# Patient Record
Sex: Male | Born: 1970 | Race: Black or African American | Hispanic: No | Marital: Married | Smoking: Never smoker
Health system: Southern US, Community
[De-identification: ages and names within clinical notes are randomized; demographics above are authoritative.]

## PROBLEM LIST (undated history)

## (undated) DIAGNOSIS — G4730 Sleep apnea, unspecified: Secondary | ICD-10-CM

## (undated) DIAGNOSIS — K219 Gastro-esophageal reflux disease without esophagitis: Secondary | ICD-10-CM

## (undated) HISTORY — DX: Morbid (severe) obesity due to excess calories: E66.01

## (undated) HISTORY — PX: QUADRICEPS TENDON REPAIR: SHX756

---

## 2006-09-09 ENCOUNTER — Other Ambulatory Visit: Payer: Self-pay

## 2006-09-09 ENCOUNTER — Emergency Department: Payer: Self-pay | Admitting: Emergency Medicine

## 2007-05-29 ENCOUNTER — Emergency Department: Payer: Self-pay | Admitting: Internal Medicine

## 2010-02-05 ENCOUNTER — Emergency Department: Payer: Self-pay | Admitting: Internal Medicine

## 2011-09-30 ENCOUNTER — Emergency Department: Payer: Self-pay | Admitting: *Deleted

## 2011-09-30 LAB — PROTIME-INR
INR: 1
Prothrombin Time: 13.7 secs (ref 11.5–14.7)

## 2011-09-30 LAB — COMPREHENSIVE METABOLIC PANEL
Albumin: 3.7 g/dL (ref 3.4–5.0)
Alkaline Phosphatase: 59 U/L (ref 50–136)
BUN: 12 mg/dL (ref 7–18)
Bilirubin,Total: 0.5 mg/dL (ref 0.2–1.0)
Chloride: 107 mmol/L (ref 98–107)
Co2: 27 mmol/L (ref 21–32)
Creatinine: 0.86 mg/dL (ref 0.60–1.30)
EGFR (African American): >60
EGFR (Non-African Amer.): >60
Glucose: 101 mg/dL — ABNORMAL HIGH (ref 65–99)
Osmolality: 285 (ref 275–301)
Total Protein: 7.2 g/dL (ref 6.4–8.2)

## 2011-09-30 LAB — CBC
HGB: 15.8 g/dL (ref 13.0–18.0)
MCH: 30.1 pg (ref 26.0–34.0)
Platelet: 174 10*3/uL (ref 150–440)
RDW: 14.3 % (ref 11.5–14.5)
WBC: 6.8 10*3/uL (ref 3.8–10.6)

## 2011-09-30 LAB — CK TOTAL AND CKMB (NOT AT ARMC)
CK, Total: 1177 U/L — ABNORMAL HIGH (ref 35–232)
CK-MB: 6 ng/mL — ABNORMAL HIGH (ref 0.5–3.6)

## 2011-09-30 LAB — TROPONIN I: Troponin-I: <0.02 ng/mL

## 2011-10-06 ENCOUNTER — Other Ambulatory Visit: Payer: Self-pay | Admitting: Internal Medicine

## 2011-10-06 LAB — COMPREHENSIVE METABOLIC PANEL
Albumin: 4 g/dL (ref 3.4–5.0)
Bilirubin,Total: 0.7 mg/dL (ref 0.2–1.0)
Calcium, Total: 8.7 mg/dL (ref 8.5–10.1)
Chloride: 106 mmol/L (ref 98–107)
Co2: 28 mmol/L (ref 21–32)
Creatinine: 1.04 mg/dL (ref 0.60–1.30)
Glucose: 100 mg/dL — ABNORMAL HIGH (ref 65–99)
Osmolality: 285 (ref 275–301)
SGOT(AST): 28 U/L (ref 15–37)

## 2011-10-15 ENCOUNTER — Ambulatory Visit: Payer: Self-pay | Admitting: Cardiology

## 2014-11-05 ENCOUNTER — Ambulatory Visit: Payer: Self-pay | Admitting: Internal Medicine

## 2015-12-26 ENCOUNTER — Emergency Department
Admission: EM | Admit: 2015-12-26 | Discharge: 2015-12-26 | Disposition: A | Discharge status: Home / Self Care | Payer: BC Managed Care – PPO | Attending: Emergency Medicine | Admitting: Emergency Medicine

## 2015-12-26 ENCOUNTER — Emergency Department: Payer: BC Managed Care – PPO

## 2015-12-26 DIAGNOSIS — Y929 Unspecified place or not applicable: Secondary | ICD-10-CM | POA: Diagnosis not present

## 2015-12-26 DIAGNOSIS — W228XXA Striking against or struck by other objects, initial encounter: Secondary | ICD-10-CM | POA: Insufficient documentation

## 2015-12-26 DIAGNOSIS — Y939 Activity, unspecified: Secondary | ICD-10-CM | POA: Diagnosis not present

## 2015-12-26 DIAGNOSIS — Y999 Unspecified external cause status: Secondary | ICD-10-CM | POA: Insufficient documentation

## 2015-12-26 DIAGNOSIS — S91331A Puncture wound without foreign body, right foot, initial encounter: Secondary | ICD-10-CM

## 2015-12-26 DIAGNOSIS — M79671 Pain in right foot: Secondary | ICD-10-CM | POA: Diagnosis present

## 2015-12-26 MED ORDER — CIPROFLOXACIN HCL 500 MG PO TABS: 500.0000 mg | ORAL_TABLET | Freq: Two times a day (BID) | ORAL | Status: AC

## 2015-12-26 MED ORDER — HYDROCODONE-ACETAMINOPHEN 5-325 MG PO TABS: 1.0000 | ORAL_TABLET | ORAL | Status: DC | PRN

## 2015-12-26 MED ORDER — NAPROXEN 500 MG PO TABS: 500.0000 mg | ORAL_TABLET | Freq: Two times a day (BID) | ORAL | Status: DC

## 2015-12-26 MED ORDER — TETANUS-DIPHTH-ACELL PERTUSSIS 5-2.5-18.5 LF-MCG/0.5 IM SUSP
0.5000 mL | Freq: Once | INTRAMUSCULAR | Status: AC
  Administered 2015-12-26: 0.5 mL via INTRAMUSCULAR
  Filled 2015-12-26: qty 0.5

## 2015-12-26 NOTE — ED Provider Notes (Signed)
[facility] Emergency Department Provider Note  ____________________________________________  Time seen: Approximately 11:37 AM  I have reviewed the triage vital signs and the nursing notes.   HISTORY  Chief Complaint Foot Pain    HPI Mario Porter is a 45 y.o. male  presenting with right foot pain for 1 day. He reports that last night he stepped on a plug and the prongs penetrated the bottom of his right foot. The wound was cleaned and dressed shortly after the incident. Current pain when bearing weight is a 6-8/10. Denies tingling, numbness, or active bleeding.    History reviewed. No pertinent past medical history.  There are no active problems to display for this patient.   History reviewed. No pertinent past surgical history.  Current Outpatient Rx  Name  Route  Sig  Dispense  Refill  . ciprofloxacin (CIPRO) 500 MG tablet   Oral   Take 1 tablet (500 mg total) by mouth 2 (two) times daily.   20 tablet   0   . HYDROcodone-acetaminophen (NORCO) 5-325 MG tablet   Oral   Take 1-2 tablets by mouth every 4 (four) hours as needed for moderate pain.   10 tablet   0   . naproxen (NAPROSYN) 500 MG tablet   Oral   Take 1 tablet (500 mg total) by mouth 2 (two) times daily with a meal.   60 tablet   0     Allergies Review of patient's allergies indicates no known allergies.  No family history on file.  Social History Social History  Substance Use Topics  . Smoking status: Never Smoker   . Smokeless tobacco: None  . Alcohol Use: Yes    Review of Systems Constitutional: No fever/chills Eyes: No visual changes. Cardiovascular: Denies chest pain. Respiratory: Denies shortness of breath. Gastrointestinal: No abdominal pain.  No nausea, no vomiting.   Musculoskeletal: Negative for back pain. Neurological: Negative for headaches, focal weakness or numbness.  10-point ROS otherwise  negative.  ____________________________________________   PHYSICAL EXAM:  VITAL SIGNS: ED Triage Vitals  Enc Vitals Group     BP 12/26/15 1109 164/104 mmHg     Pulse Rate 12/26/15 1109 105     Resp 12/26/15 1109 18     Temp 12/26/15 1109 98.3 F (36.8 C)     Temp Source 12/26/15 1109 Oral     SpO2 12/26/15 1109 97 %     Weight 12/26/15 1104 330 lb (149.687 kg)     Height 12/26/15 1104 6\' 3"  (1.905 m)     Head Cir --      Peak Flow --      Pain Score 12/26/15 1104 7     Pain Loc --      Pain Edu? --      Excl. in GC? --     Constitutional: Alert and oriented. Well appearing and in no acute distress. Eyes: Conjunctivae are normal. PERRL. Head: Atraumatic. Cardiovascular: Normal rate, regular rhythm.  Respiratory: Normal respiratory effort.  No retractions. Lungs CTAB. Musculoskeletal: No lower extremity tenderness nor edema.  No joint effusions. Neurologic:  Normal speech and language. No gross focal neurologic deficits are appreciated.  Skin:  Bottom right foot puncture wound.  Psychiatric: Mood and affect are normal. Speech and behavior are normal.  ____________________________________________   LABS (all labs ordered are listed, but only abnormal results are displayed)  Labs Reviewed - No data to display ____________________________________________  RADIOLOGY No acute fracture or subluxation. No radiopaque foreign body.  ____________________________________________   PROCEDURES  Procedure(s) performed: None  Critical Care performed: No  ____________________________________________   INITIAL IMPRESSION / ASSESSMENT AND PLAN / ED COURSE  Pertinent labs & imaging results that were available during my care of the patient were reviewed by me and considered in my medical decision making (see chart for details).  Puncture wound on plantar aspect of the right foot. Prescribed Ciprofloxacillin, 10 tablets Norco, and naproxen. Advised patient to keep wound  clean and dressed. Follow up with PCP or back to the ED if symptoms persist or worsen.  ____________________________________________   FINAL CLINICAL IMPRESSION(S) / ED DIAGNOSES  Final diagnoses:  Puncture wound of plantar aspect of foot, right, initial encounter     This chart was dictated using voice recognition software/Dragon. Despite best efforts to proofread, errors can occur which can change the meaning. Any change was purely unintentional.   Evangeline Dakin, PA-C 12/26/15 1430  Sharman Cheek, MD 12/26/15 1536

## 2015-12-26 NOTE — ED Notes (Signed)
See triage note   States he stepped on plug last pm    States he the prongs went into foot  Increased pain this am

## 2015-12-26 NOTE — ED Notes (Signed)
Pt states he stepped on the prongs of a plug that stuck into his right foot yesterday and is having pain and swelling.

## 2015-12-26 NOTE — Discharge Instructions (Signed)
Puncture Wound A puncture wound is an injury that is caused by a sharp, thin object that goes through (penetrates) your skin. Usually, a puncture wound does not leave a large opening in your skin, so it may not bleed a lot. However, when you get a puncture wound, dirt or other materials (foreign bodies) can be forced into your wound and break off inside. This increases the chance of infection, such as tetanus. CAUSES Puncture wounds are caused by any sharp, thin object that goes through your skin, such as:  Animal teeth, as with an animal bite.  Sharp, pointed objects, such as nails, splinters of glass, fishhooks, and needles. SYMPTOMS Symptoms of a puncture wound include:  Pain.  Bleeding.  Swelling.  Bruising.  Fluid leaking from the wound.  Numbness, tingling, or loss of function. DIAGNOSIS This condition is diagnosed with a medical history and physical exam. Your wound will be checked to see if it contains any foreign bodies. You may also have X-rays or other imaging tests. TREATMENT Treatment for a puncture wound depends on how serious the wound is. It also depends on whether the wound contains any foreign bodies. Treatment for all types of puncture wounds usually starts with:  Controlling the bleeding.  Washing out the wound with a germ-free (sterile) salt-water solution.  Checking the wound for foreign bodies. Treatment may also include:  Having the wound opened surgically to remove a foreign object.  Closing the wound with stitches (sutures) if it continues to bleed.  Covering the wound with antibiotic ointments and a bandage (dressing).  Receiving a tetanus shot.  Receiving a rabies vaccine. HOME CARE INSTRUCTIONS Medicines  Take or apply over-the-counter and prescription medicines only as told by your health care provider.  If you were prescribed an antibiotic, take or apply it as told by your health care provider. Do not stop using the antibiotic even if  your condition improves. Wound Care  There are many ways to close and cover a wound. For example, a wound can be covered with sutures, skin glue, or adhesive strips. Follow instructions from your health care provider about:  How to take care of your wound.  When and how you should change your dressing.  When you should remove your dressing.  Removing whatever was used to close your wound.  Keep the dressing dry as told by your health care provider. Do not take baths, swim, use a hot tub, or do anything that would put your wound underwater until your health care provider approves.  Clean the wound as told by your health care provider.  Do not scratch or pick at the wound.  Check your wound every day for signs of infection. Watch for:  Redness, swelling, or pain.  Fluid, blood, or pus. General Instructions  Raise (elevate) the injured area above the level of your heart while you are sitting or lying down.  If your puncture wound is in your foot, ask your health care provider if you need to avoid putting weight on your foot and for how long.  Keep all follow-up visits as told by your health care provider. This is important. SEEK MEDICAL CARE IF:  You received a tetanus shot and you have swelling, severe pain, redness, or bleeding at the injection site.  You have a fever.  Your sutures come out.  You notice a bad smell coming from your wound or your dressing.  You notice something coming out of your wound, such as wood or glass.  Your   pain is not controlled with medicine.  You have increased redness, swelling, or pain at the site of your wound.  You have fluid, blood, or pus coming from your wound.  You notice a change in the color of your skin near your wound.  You need to change the dressing frequently due to fluid, blood, or pus draining from your wound.  You develop a new rash.  You develop numbness around your wound. SEEK IMMEDIATE MEDICAL CARE IF:  You  develop severe swelling around your wound.  Your pain suddenly increases and is severe.  You develop painful skin lumps.  You have a red streak going away from your wound.  The wound is on your hand or foot and you cannot properly move a finger or toe.  The wound is on your hand or foot and you notice that your fingers or toes look pale or bluish.   This information is not intended to replace advice given to you by your health care provider. Make sure you discuss any questions you have with your health care provider.   Document Released: 03/07/2005 Document Revised: 02/16/2015 Document Reviewed: 07/21/2014 Elsevier Interactive Patient Education 2016 Elsevier Inc.  

## 2016-11-27 ENCOUNTER — Ambulatory Visit (INDEPENDENT_AMBULATORY_CARE_PROVIDER_SITE_OTHER): Payer: BC Managed Care – PPO | Admitting: Family Medicine

## 2016-11-27 ENCOUNTER — Encounter: Payer: Self-pay | Admitting: Family Medicine

## 2016-11-27 VITALS — BP 140/90 | HR 97 | Temp 98.0°F | Resp 14 | Ht 76.0 in | Wt 352.0 lb

## 2016-11-27 DIAGNOSIS — R0683 Snoring: Secondary | ICD-10-CM

## 2016-11-27 DIAGNOSIS — G4733 Obstructive sleep apnea (adult) (pediatric): Secondary | ICD-10-CM | POA: Insufficient documentation

## 2016-11-27 DIAGNOSIS — R030 Elevated blood-pressure reading, without diagnosis of hypertension: Secondary | ICD-10-CM | POA: Diagnosis not present

## 2016-11-27 DIAGNOSIS — Z0001 Encounter for general adult medical examination with abnormal findings: Secondary | ICD-10-CM

## 2016-11-27 DIAGNOSIS — I10 Essential (primary) hypertension: Secondary | ICD-10-CM | POA: Insufficient documentation

## 2016-11-27 LAB — HEMOGLOBIN A1C: Hgb A1c MFr Bld: 6.3 % (ref 4.6–6.5)

## 2016-11-27 LAB — COMPREHENSIVE METABOLIC PANEL
ALBUMIN: 4.7 g/dL (ref 3.5–5.2)
ALK PHOS: 52 U/L (ref 39–117)
ALT: 31 U/L (ref 0–53)
AST: 25 U/L (ref 0–37)
BUN: 13 mg/dL (ref 6–23)
CO2: 28 mEq/L (ref 19–32)
CREATININE: 1.05 mg/dL (ref 0.40–1.50)
Calcium: 9.9 mg/dL (ref 8.4–10.5)
Chloride: 103 mEq/L (ref 96–112)
GFR: 97.11 mL/min (ref >60.00)
Glucose, Bld: 109 mg/dL — ABNORMAL HIGH (ref 70–99)
POTASSIUM: 4.6 meq/L (ref 3.5–5.1)
SODIUM: 139 meq/L (ref 135–145)
TOTAL PROTEIN: 7.5 g/dL (ref 6.0–8.3)
Total Bilirubin: 1 mg/dL (ref 0.2–1.2)

## 2016-11-27 LAB — LIPID PANEL
CHOLESTEROL: 195 mg/dL (ref 0–200)
HDL: 55.5 mg/dL (ref >39.00)
LDL CALC: 118 mg/dL — AB (ref 0–99)
NONHDL: 139.97
Total CHOL/HDL Ratio: 4
Triglycerides: 111 mg/dL (ref 0.0–149.0)
VLDL: 22.2 mg/dL (ref 0.0–40.0)

## 2016-11-27 LAB — CBC
HCT: 50.4 % (ref 39.0–52.0)
Hemoglobin: 17 g/dL (ref 13.0–17.0)
MCHC: 33.7 g/dL (ref 30.0–36.0)
MCV: 89.9 fl (ref 78.0–100.0)
PLATELETS: 178 10*3/uL (ref 150.0–400.0)
RBC: 5.61 Mil/uL (ref 4.22–5.81)
RDW: 14.2 % (ref 11.5–15.5)
WBC: 7.8 10*3/uL (ref 4.0–10.5)

## 2016-11-27 LAB — PSA: PSA: 0.54 ng/mL (ref 0.10–4.00)

## 2016-11-27 LAB — TSH: TSH: 2 u[IU]/mL (ref 0.35–4.50)

## 2016-11-27 NOTE — Assessment & Plan Note (Signed)
Screening labs today. Advise dietary changes and exercise to promote weight loss. This will also help with his blood pressure. He is in agreement to pursue lifestyle changes. Tetanus up-to-date. Patient reporting snoring and fatigue. Given his obesity, he is concerned for OSA. Arranging sleep study.

## 2016-11-27 NOTE — Patient Instructions (Signed)
Follow up in 3 months.  Take care  Dr. Adriana Simas    Health Maintenance, Male A healthy lifestyle and preventive care is important for your health and wellness. Ask your health care provider about what schedule of regular examinations is right for you. What should I know about weight and diet? Eat a Healthy Diet  Eat plenty of vegetables, fruits, whole grains, low-fat dairy products, and lean protein.  Do not eat a lot of foods high in solid fats, added sugars, or salt.  Maintain a Healthy Weight Regular exercise can help you achieve or maintain a healthy weight. You should:  Do at least 150 minutes of exercise each week. The exercise should increase your heart rate and make you sweat (moderate-intensity exercise).  Do strength-training exercises at least twice a week.  Watch Your Levels of Cholesterol and Blood Lipids  Have your blood tested for lipids and cholesterol every 5 years starting at 48 years of age. If you are at high risk for heart disease, you should start having your blood tested when you are 48 years old. You may need to have your cholesterol levels checked more often if: ? Your lipid or cholesterol levels are high. ? You are older than 48 years of age. ? You are at high risk for heart disease.  What should I know about cancer screening? Many types of cancers can be detected early and may often be prevented. Lung Cancer  You should be screened every year for lung cancer if: ? You are a current smoker who has smoked for at least 30 years. ? You are a former smoker who has quit within the past 15 years.  Talk to your health care provider about your screening options, when you should start screening, and how often you should be screened.  Colorectal Cancer  Routine colorectal cancer screening usually begins at 48 years of age and should be repeated every 5-10 years until you are 48 years old. You may need to be screened more often if early forms of precancerous polyps  or small growths are found. Your health care provider may recommend screening at an earlier age if you have risk factors for colon cancer.  Your health care provider may recommend using home test kits to check for hidden blood in the stool.  A small camera at the end of a tube can be used to examine your colon (sigmoidoscopy or colonoscopy). This checks for the earliest forms of colorectal cancer.  Prostate and Testicular Cancer  Depending on your age and overall health, your health care provider may do certain tests to screen for prostate and testicular cancer.  Talk to your health care provider about any symptoms or concerns you have about testicular or prostate cancer.  Skin Cancer  Check your skin from head to toe regularly.  Tell your health care provider about any new moles or changes in moles, especially if: ? There is a change in a mole's size, shape, or color. ? You have a mole that is larger than a pencil eraser.  Always use sunscreen. Apply sunscreen liberally and repeat throughout the day.  Protect yourself by wearing long sleeves, pants, a wide-brimmed hat, and sunglasses when outside.  What should I know about heart disease, diabetes, and high blood pressure?  If you are 55-40 years of age, have your blood pressure checked every 3-5 years. If you are 48 years of age or older, have your blood pressure checked every year. You should have your blood  pressure measured twice-once when you are at a hospital or clinic, and once when you are not at a hospital or clinic. Record the average of the two measurements. To check your blood pressure when you are not at a hospital or clinic, you can use: ? An automated blood pressure machine at a pharmacy. ? A home blood pressure monitor.  Talk to your health care provider about your target blood pressure.  If you are between 7645-48 years old, ask your health care provider if you should take aspirin to prevent heart disease.  Have  regular diabetes screenings by checking your fasting blood sugar level. ? If you are at a normal weight and have a low risk for diabetes, have this test once every three years after the age of 48. ? If you are overweight and have a high risk for diabetes, consider being tested at a younger age or more often.  A one-time screening for abdominal aortic aneurysm (AAA) by ultrasound is recommended for men aged 65-75 years who are current or former smokers. What should I know about preventing infection? Hepatitis B If you have a higher risk for hepatitis B, you should be screened for this virus. Talk with your health care provider to find out if you are at risk for hepatitis B infection. Hepatitis C Blood testing is recommended for:  Everyone born from [birth year] through 1967.  Anyone with known risk factors for hepatitis C.  Sexually Transmitted Diseases (STDs)  You should be screened each year for STDs including gonorrhea and chlamydia if: ? You are sexually active and are younger than 48 years of age. ? You are older than 48 years of age and your health care provider tells you that you are at risk for this type of infection. ? Your sexual activity has changed since you were last screened and you are at an increased risk for chlamydia or gonorrhea. Ask your health care provider if you are at risk.  Talk with your health care provider about whether you are at high risk of being infected with HIV. Your health care provider may recommend a prescription medicine to help prevent HIV infection.  What else can I do?  Schedule regular health, dental, and eye exams.  Stay current with your vaccines (immunizations).  Do not use any tobacco products, such as cigarettes, chewing tobacco, and e-cigarettes. If you need help quitting, ask your health care provider.  Limit alcohol intake to no more than 2 drinks per day. One drink equals 12 ounces of beer, 5 ounces of wine, or 1 ounces of hard liquor.  Do  not use street drugs.  Do not share needles.  Ask your health care provider for help if you need support or information about quitting drugs.  Tell your health care provider if you often feel depressed.  Tell your health care provider if you have ever been abused or do not feel safe at home. This information is not intended to replace advice given to you by your health care provider. Make sure you discuss any questions you have with your health care provider. Document Released: 11/24/2007 Document Revised: 01/25/2016 Document Reviewed: 03/01/2015 Elsevier Interactive Patient Education  [employer]2018 Elsevier Inc.

## 2016-11-27 NOTE — Progress Notes (Signed)
Pre-visit discussion using our clinic review tool. No additional management support is needed unless otherwise documented below in the visit note.  

## 2016-11-27 NOTE — Progress Notes (Signed)
   Subjective:  Patient ID: Mario Porter, male    DOB: 08/15/1970  Age: 46 y.o. MRN: [id number]  CC: Establish care; Concern for OSA  HPI Mario MaeShawn Beale is a 46 y.o. male presents to the clinic today to establish care. He is in need of a physical exam. He is concerned that he has OSA.  Preventative Healthcare  Immunizations  Tetanus - Up to date.  Prostate cancer screening: PSA today.  Labs: Screening labs.  Exercise: Not currently exercising.  Alcohol use: Yes.   Smoking/tobacco use: No.  PMH, Surgical Hx, Family Hx, Social History reviewed and updated as below.  Past Medical History:  Diagnosis Date  . Morbid obesity (HCC)    Past Surgical History:  Procedure Laterality Date  . QUADRICEPS TENDON REPAIR     Family History  Problem Relation Age of Onset  . Hyperlipidemia Father    Social History  Substance Use Topics  . Smoking status: Never Smoker  . Smokeless tobacco: Never Used  . Alcohol use Yes    Review of Systems  Constitutional: Positive for fatigue.       Weight gain.  Respiratory:       Snores loudly.  Neurological: Positive for dizziness and headaches.  All other systems reviewed and are negative.  Objective:   Today's Vitals: BP 140/90 (BP Location: Right Arm, Cuff Size: Large)   Pulse 97   Temp 98 F (36.7 C) (Oral)   Resp 14   Ht 6\' 4"  (1.93 m)   Wt (!) 352 lb (159.7 kg)   BMI 42.85 kg/m   Physical Exam  Constitutional: He is oriented to person, place, and time. He appears well-developed and well-nourished. No distress.  Morbidly obese.  HENT:  Head: Normocephalic and atraumatic.  Nose: Nose normal.  Mouth/Throat: Oropharynx is clear and moist. No oropharyngeal exudate.  Normal TM's bilaterally.   Eyes: Conjunctivae are normal. No scleral icterus.  Neck: Neck supple. No thyromegaly present.  Cardiovascular: Normal rate and regular rhythm.   No murmur heard. Pulmonary/Chest: Effort normal and breath sounds normal. He has no  wheezes. He has no rales.  Abdominal: Soft. He exhibits no distension. There is no tenderness. There is no rebound and no guarding.  Musculoskeletal: Normal range of motion. He exhibits no edema.  Lymphadenopathy:    He has no cervical adenopathy.  Neurological: He is alert and oriented to person, place, and time.  Skin: Skin is warm and dry. No rash noted.  Psychiatric: He has a normal mood and affect.  Vitals reviewed.  Assessment & Plan:   Problem List Items Addressed This Visit      Other   Elevated BP without diagnosis of hypertension   Encounter for general adult medical examination with abnormal findings - Primary    Screening labs today. Advise dietary changes and exercise to promote weight loss. This will also help with his blood pressure. He is in agreement to pursue lifestyle changes. Tetanus up-to-date. Patient reporting snoring and fatigue. Given his obesity, he is concerned for OSA. Arranging sleep study.      Relevant Orders   CBC   Hemoglobin A1c   Comprehensive metabolic panel   Lipid panel   PSA   TSH   Snoring   Relevant Orders   Split night study     Follow-up: 3 months  Laporcha Marchesi Adriana Simasook DO [facility] [place]

## 2016-11-30 ENCOUNTER — Telehealth: Payer: Self-pay | Admitting: *Deleted

## 2016-11-30 NOTE — Telephone Encounter (Signed)
Patient has requested a update on his sleep study referral Pt contact [phone]

## 2016-12-04 NOTE — Telephone Encounter (Signed)
Pt was notified when he returned call, he understood.

## 2016-12-04 NOTE — Telephone Encounter (Signed)
Tried calling pt back to let him know the referral has been sent to [facility]. It will take a couple weeks for insurance authorization, but they will call him to schedule an appt for home sleep study.

## 2016-12-14 ENCOUNTER — Telehealth: Payer: Self-pay | Admitting: Family Medicine

## 2016-12-14 NOTE — Telephone Encounter (Signed)
Pt called requesting a update on his sleep study results. Advised pt that it does not look like we have received them yet.

## 2016-12-18 NOTE — Telephone Encounter (Signed)
Requested sleep study from [place] from [facility] [phone].

## 2016-12-18 NOTE — Telephone Encounter (Signed)
Results in your folder for review.

## 2016-12-19 NOTE — Telephone Encounter (Signed)
Patient advised of below and verbalized understanding.  

## 2016-12-19 NOTE — Telephone Encounter (Signed)
Has OSA. Recommended to have titration study.

## 2016-12-19 NOTE — Telephone Encounter (Signed)
Do I need to call and order sleep titration study through [facility] or will that be placed through referral . Thanks

## 2016-12-20 NOTE — Telephone Encounter (Signed)
Per Efraim KaufmannMelissa should be arranged by company.

## 2017-01-01 ENCOUNTER — Telehealth: Payer: Self-pay | Admitting: *Deleted

## 2017-01-01 NOTE — Telephone Encounter (Signed)
This was supposed to be already arranged/done by the company. Can you look into this for me?
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
## 2017-01-01 NOTE — Telephone Encounter (Signed)
Pt has requested a in reference to having a titration study   Pt contact [phone]

## 2017-01-07 ENCOUNTER — Telehealth: Payer: Self-pay | Admitting: Family Medicine

## 2017-01-07 NOTE — Telephone Encounter (Signed)
Insurance has called to get diangosis code for his equipment. [facility] has arranged this.

## 2017-01-07 NOTE — Telephone Encounter (Signed)
Joni Reiningicole from [employer] is requesting a DME Code for pt's cpap machine. She is trying to confirm if pt needs prior authorization. Please call Joni Reiningicole at [phone], any one that answerers the phone can help.

## 2017-01-08 NOTE — Telephone Encounter (Signed)
Spoke with Suzi RootsKaletpa B at [facility] and DME provider has called in today and  They were advised CPAP doesn't require prior authorization .

## 2017-01-25 NOTE — Telephone Encounter (Signed)
Patient states he still heard in regards to his CPAP machine   Can you help him in regards to who to contact.   Please thanks.

## 2017-01-25 NOTE — Telephone Encounter (Signed)
Pt called looking for an update on this and also wanted to know if we received the report from the Respiratory therapist. Please advise, thank you!  Call pt @ [phone]

## 2017-01-25 NOTE — Telephone Encounter (Signed)
Tried calling patient voicemail box full

## 2017-01-28 NOTE — Telephone Encounter (Signed)
Tried calling pt-vm is full. He can not get a machine until he has a cpap titration study. I have sent this order to [facility].

## 2017-01-31 ENCOUNTER — Ambulatory Visit: Payer: BC Managed Care – PPO | Attending: Neurology

## 2017-01-31 DIAGNOSIS — G4733 Obstructive sleep apnea (adult) (pediatric): Secondary | ICD-10-CM | POA: Diagnosis not present

## 2017-01-31 DIAGNOSIS — G4761 Periodic limb movement disorder: Secondary | ICD-10-CM | POA: Insufficient documentation

## 2017-02-19 ENCOUNTER — Telehealth: Payer: Self-pay | Admitting: *Deleted

## 2017-02-19 NOTE — Telephone Encounter (Signed)
Pt requested his sleep study results  Pt contact [phone]

## 2017-02-19 NOTE — Telephone Encounter (Signed)
Can you advise on what sleep study results showed please ? Thanks

## 2017-02-19 NOTE — Telephone Encounter (Signed)
Showed OSA. This has been asked previously. I filled out paperwork for CPAP.

## 2017-02-19 NOTE — Telephone Encounter (Signed)
Spoke with patient he states [facility]   I advised him that we had faxed Certificate of Medical Necessity for CPAP  to [facility]. Patient verbalized understanding.

## 2017-02-27 ENCOUNTER — Ambulatory Visit: Payer: BC Managed Care – PPO | Admitting: Family Medicine

## 2017-02-28 ENCOUNTER — Ambulatory Visit (INDEPENDENT_AMBULATORY_CARE_PROVIDER_SITE_OTHER): Payer: BC Managed Care – PPO | Admitting: Family Medicine

## 2017-02-28 ENCOUNTER — Encounter: Payer: Self-pay | Admitting: Family Medicine

## 2017-02-28 DIAGNOSIS — I10 Essential (primary) hypertension: Secondary | ICD-10-CM

## 2017-02-28 DIAGNOSIS — G4733 Obstructive sleep apnea (adult) (pediatric): Secondary | ICD-10-CM

## 2017-02-28 DIAGNOSIS — J309 Allergic rhinitis, unspecified: Secondary | ICD-10-CM | POA: Diagnosis not present

## 2017-02-28 MED ORDER — FLUTICASONE PROPIONATE 50 MCG/ACT NA SUSP: 2.0000 | Freq: Every day | NASAL | 6 refills | Status: DC

## 2017-02-28 MED ORDER — CETIRIZINE HCL 10 MG PO TABS: 10.0000 mg | ORAL_TABLET | Freq: Every day | ORAL | 1 refills | Status: DC

## 2017-02-28 NOTE — Assessment & Plan Note (Signed)
New problem. Treating with Flonase and Zyrtec.

## 2017-02-28 NOTE — Patient Instructions (Signed)
Medications as prescribed. ° °Take care ° °Dr. Allon Costlow  °

## 2017-02-28 NOTE — Progress Notes (Signed)
Subjective:  Patient ID: Mario Porter, male    DOB: 09-19-1970  Age: 46 y.o. MRN: [id number]  CC: Follow up; congestion  HPI:  46 year old male with hypertension, OSA presents for follow. Complains of congestion.  Obstructive sleep apnea  Patient has had a sleep study and has obstructive sleep apnea.  OSA causes daytime sleepiness and issues with cognition as a result.  He is going to get his CPAP machine today.  Hypertension  BP mildly elevated today.  Needs weight loss.  He is on no medications at this time.  Congestion  Patient has had recent sinus congestion and pressure.  Patient states that he likely has underlying seasonal allergies.  No purulent nasal discharge.  No associated fever.  No medications or interventions tried.  No known exacerbating factors. No other complaints or concerns this time.  Social Hx   Social History   Social History  . Marital status: Married    Spouse name: N/A  . Number of children: N/A  . Years of education: N/A   Social History Main Topics  . Smoking status: Never Smoker  . Smokeless tobacco: Never Used  . Alcohol use Yes  . Drug use: No  . Sexual activity: Not Asked   Other Topics Concern  . None   Social History Narrative  . None    Review of Systems  Constitutional: Positive for fatigue.  HENT: Positive for congestion.   Psychiatric/Behavioral: Positive for sleep disturbance.   Objective:  BP (!) 138/100 (BP Location: Left Arm, Patient Position: Sitting, Cuff Size: Large)   Pulse 93   Temp 98.3 F (36.8 C) (Oral)   Resp 18   Wt (!) 357 lb 6 oz (162.1 kg)   SpO2 95%   BMI 43.50 kg/m   BP/Weight 02/28/2017 11/27/2016 12/26/2015  Systolic BP 138 140 139  Diastolic BP 100 90 112  Wt. (Lbs) 357.38 352 330  BMI 43.5 42.85 41.25    Physical Exam  Constitutional: He is oriented to person, place, and time. He appears well-developed. No distress.  HENT:  Head: Normocephalic and atraumatic.    Edematous/enlarged turbinates.  Cardiovascular: Normal rate and regular rhythm.   No murmur heard. Pulmonary/Chest: Effort normal. He has no wheezes. He has no rales.  Neurological: He is alert and oriented to person, place, and time.  Psychiatric: He has a normal mood and affect.  Vitals reviewed.   Lab Results  Component Value Date   WBC 7.8 11/27/2016   HGB 17.0 11/27/2016   HCT 50.4 11/27/2016   PLT 178.0 11/27/2016   GLUCOSE 109 (H) 11/27/2016   CHOL 195 11/27/2016   TRIG 111.0 11/27/2016   HDL 55.50 11/27/2016   LDLCALC 118 (H) 11/27/2016   ALT 31 11/27/2016   AST 25 11/27/2016   NA 139 11/27/2016   K 4.6 11/27/2016   CL 103 11/27/2016   CREATININE 1.05 11/27/2016   BUN 13 11/27/2016   CO2 28 11/27/2016   TSH 2.00 11/27/2016   PSA 0.54 11/27/2016   INR 1.0 09/30/2011   HGBA1C 6.3 11/27/2016    Assessment & Plan:   Problem List Items Addressed This Visit    Allergic rhinitis    New problem. Treating with Flonase and Zyrtec.      Essential hypertension    BP mildly elevated. Stable. Needs weight loss.      OSA (obstructive sleep apnea)    Patient with confirmed OSA. Still having symptoms as he does not yet have his CPAP machine.  Experiencing excessive daytime sleepiness. Going to get CPAP today.         Meds ordered this encounter  Medications  . fluticasone (FLONASE) 50 MCG/ACT nasal spray    Sig: Place 2 sprays into both nostrils daily.    Dispense:  16 g    Refill:  6  . cetirizine (ZYRTEC) 10 MG tablet    Sig: Take 1 tablet (10 mg total) by mouth daily.    Dispense:  90 tablet    Refill:  1     Follow-up: PRN  Everlene Other DO [facility]

## 2017-02-28 NOTE — Assessment & Plan Note (Signed)
Patient with confirmed OSA. Still having symptoms as he does not yet have his CPAP machine. Experiencing excessive daytime sleepiness. Going to get CPAP today.

## 2017-02-28 NOTE — Assessment & Plan Note (Signed)
BP mildly elevated. Stable. Needs weight loss.

## 2017-12-02 ENCOUNTER — Encounter: Payer: BC Managed Care – PPO | Admitting: Family

## 2017-12-26 ENCOUNTER — Ambulatory Visit: Payer: BC Managed Care – PPO | Admitting: Podiatry

## 2017-12-26 ENCOUNTER — Encounter: Payer: Self-pay | Admitting: Podiatry

## 2017-12-26 ENCOUNTER — Encounter

## 2017-12-26 DIAGNOSIS — M79674 Pain in right toe(s): Secondary | ICD-10-CM | POA: Diagnosis not present

## 2017-12-26 DIAGNOSIS — B351 Tinea unguium: Secondary | ICD-10-CM

## 2017-12-26 DIAGNOSIS — M79675 Pain in left toe(s): Secondary | ICD-10-CM | POA: Diagnosis not present

## 2017-12-26 DIAGNOSIS — L600 Ingrowing nail: Secondary | ICD-10-CM | POA: Diagnosis not present

## 2017-12-26 NOTE — Progress Notes (Addendum)
This patient presents to the office with chief complaint of long thick painful nails both feet x 6 weeks.  He states the nails are painful walking and wearing his shoes.  He is unable to self treat.  He says he goes to the nail salon, but they never cut the nails to his liking.  Marland Kitchen.  He says that the nails are painful, but especially the big toenail on the left foot. He says that he experiences pain on the inside border by the end of the day.  He believes the nail is ingrown and presents the office today for an evaluation and treatment of his nails. He has been soaking his toe left foot in epsom salts.  General Appearance  Alert, conversant and in no acute stress.  Vascular  Dorsalis pedis and posterior tibial  pulses are palpable  bilaterally.  Capillary return is within normal limits  bilaterally. Temperature is within normal limits  bilaterally.  Neurologic  Senn-Weinstein monofilament wire test within normal limits  bilaterally. Muscle power within normal limits bilaterally.  Nails Thick disfigured discolored nails with subungual debris  from hallux to fifth toes bilaterally. No evidence of bacterial infection or drainage bilaterally. The lateral border of the left great toenail is unattached and pivoting causing pain to develop along the medial border left great toenail.  No evidence of redness, swelling or drainage.  Orthopedic  No limitations of motion of motion feet .  No crepitus or effusions noted.  No bony pathology or digital deformities noted.  Skin  normotropic skin with no porokeratosis noted bilaterally.  No signs of infections or ulcers noted.    Onychomycosis  B/L  IE  Debridement of nails  B/L.  RTC 3 months.  There is no evidence of any infection to his left hallux nail plate.  I believe his pain is due to the unattached lateral border.  His nail was debrided and he left the office pain-free.   Helane GuntherGregory Ridhi Hoffert DPM

## 2018-01-15 ENCOUNTER — Encounter: Payer: BC Managed Care – PPO | Admitting: Family

## 2018-03-02 IMAGING — DX DG FOOT COMPLETE 3+V*R*
3 series · 3 of 3 positions shown · non-contrast
Comparison: None.

CLINICAL DATA: Right foot pain, stepped on a plug at home
yesterday, plantar laceration in the region of second and third
metatarsal.

EXAM:
RIGHT FOOT COMPLETE - 3+ VIEW

[foot ap]
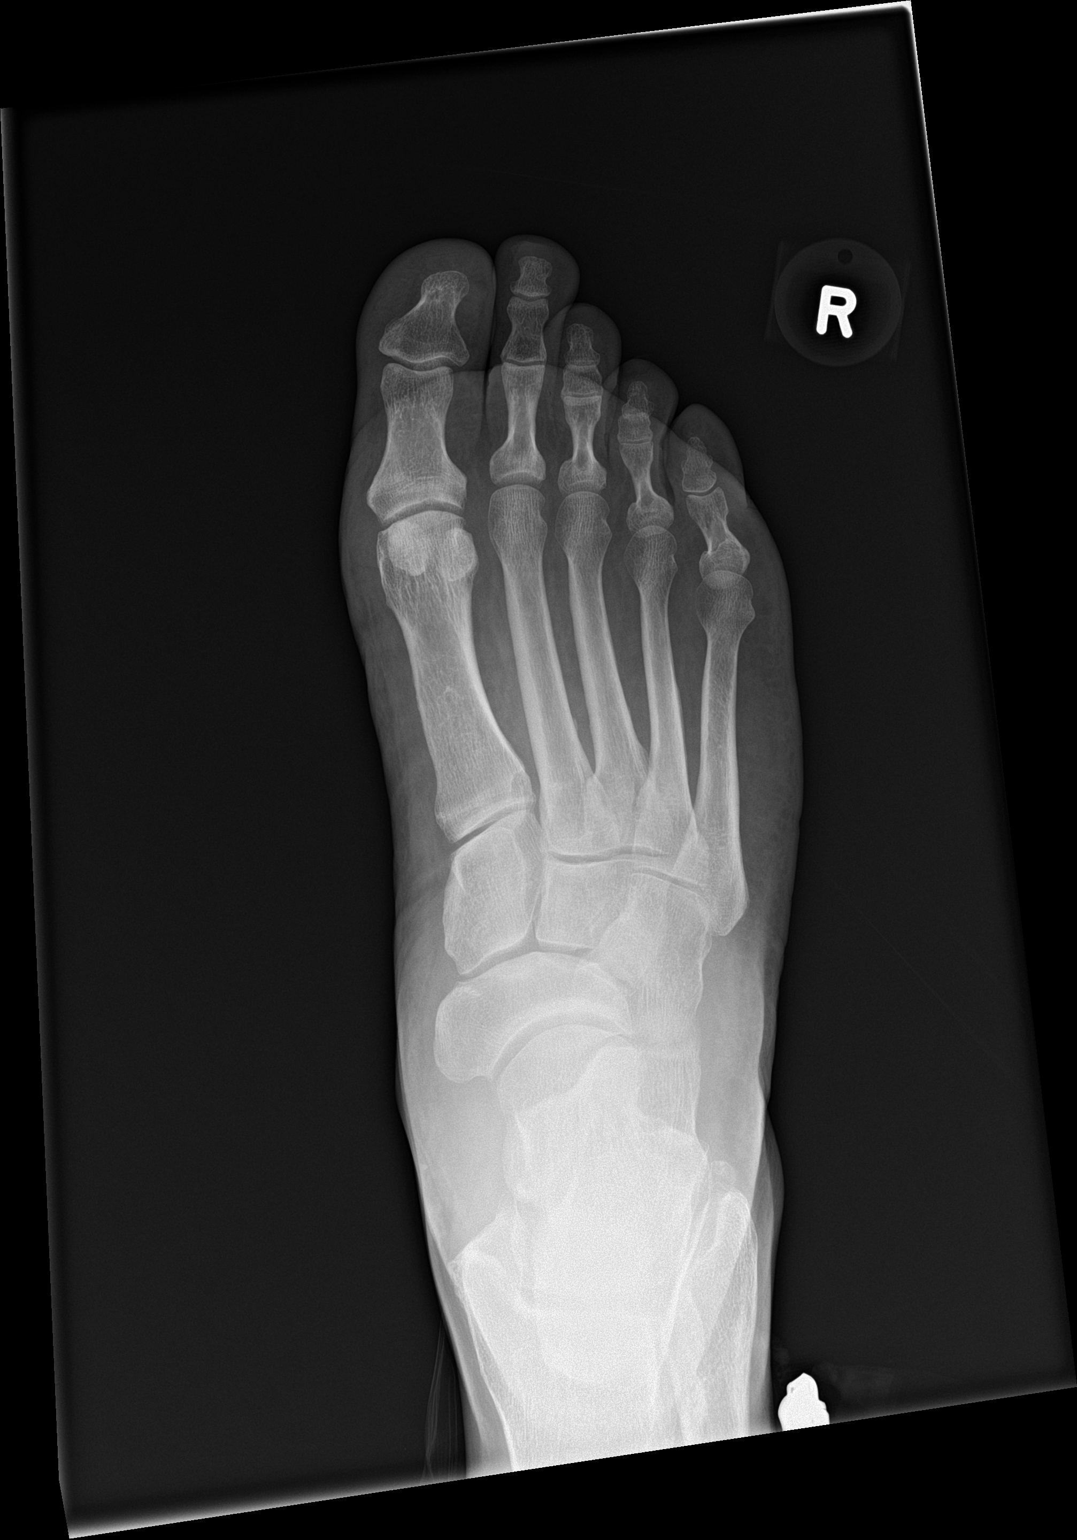

[foot obl]
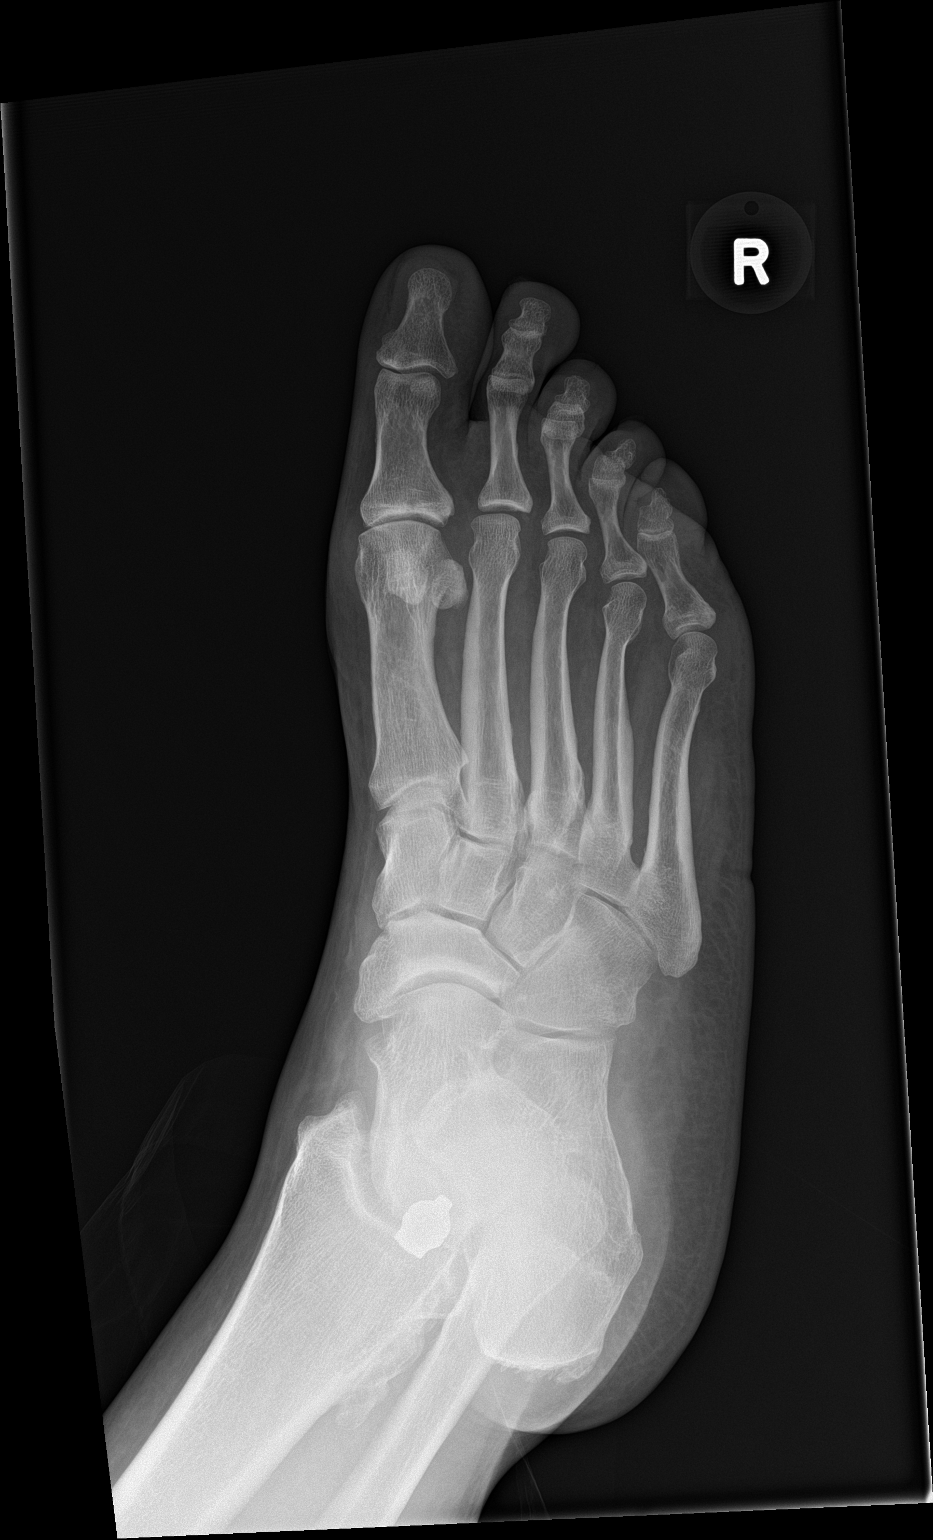

[foot lat]
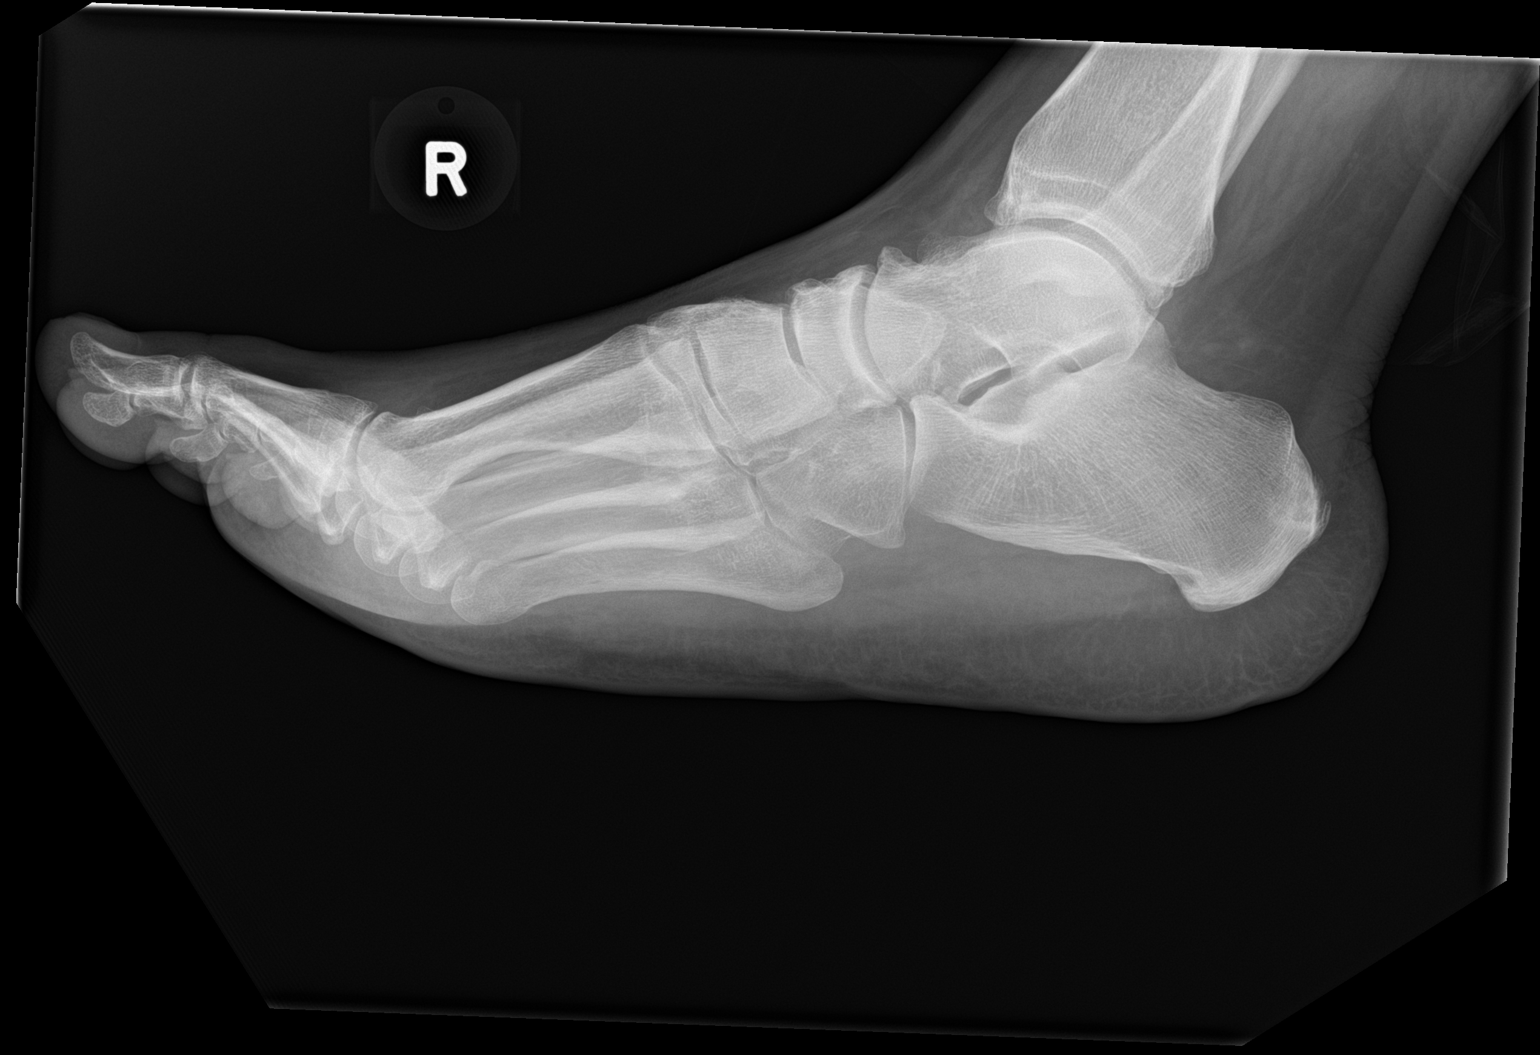

[3 of 3 positions shown; findings below may reference images not displayed]

FINDINGS: Three views of the right foot submitted. No acute fracture or
subluxation. No radiopaque foreign body. Mild dorsal spurring tarsal
region.
IMPRESSION: No acute fracture or subluxation.  No radiopaque foreign body.

## 2018-03-31 ENCOUNTER — Ambulatory Visit: Payer: BC Managed Care – PPO | Admitting: Podiatry

## 2018-12-22 ENCOUNTER — Other Ambulatory Visit: Payer: Self-pay

## 2018-12-22 ENCOUNTER — Ambulatory Visit (INDEPENDENT_AMBULATORY_CARE_PROVIDER_SITE_OTHER): Payer: BC Managed Care – PPO | Admitting: Family Medicine

## 2018-12-22 ENCOUNTER — Telehealth: Payer: Self-pay | Admitting: Family Medicine

## 2018-12-22 ENCOUNTER — Encounter: Payer: Self-pay | Admitting: Family Medicine

## 2018-12-22 DIAGNOSIS — Z1211 Encounter for screening for malignant neoplasm of colon: Secondary | ICD-10-CM

## 2018-12-22 DIAGNOSIS — Z125 Encounter for screening for malignant neoplasm of prostate: Secondary | ICD-10-CM | POA: Diagnosis not present

## 2018-12-22 DIAGNOSIS — K219 Gastro-esophageal reflux disease without esophagitis: Secondary | ICD-10-CM | POA: Diagnosis not present

## 2018-12-22 MED ORDER — PANTOPRAZOLE SODIUM 40 MG PO TBEC: 40.0000 mg | DELAYED_RELEASE_TABLET | Freq: Every day | ORAL | 1 refills | Status: AC

## 2018-12-22 NOTE — Progress Notes (Signed)
Patient ID: Mario Porter, male   DOB: 10/26/1970, 48 y.o.   MRN: [id number]    Virtual Visit via video Note  This visit type was conducted due to national recommendations for restrictions regarding the COVID-19 pandemic (e.g. social distancing).  This format is felt to be most appropriate for this patient at this time.  All issues noted in this document were discussed and addressed.  No physical exam was performed (except for noted visual exam findings with Video Visits).   I connected with Mario Porter today at  9:20 AM EDT by a video enabled telemedicine application and verified that I am speaking with the correct person using two identifiers. Location patient: home Location provider: work or home office Persons participating in the virtual visit: patient, provider  I discussed the limitations, risks, security and privacy concerns of performing an evaluation and management service by video and the availability of in person appointments. I also discussed with the patient that there may be a patient responsible charge related to this service. The patient expressed understanding and agreed to proceed.  HPI:  Patient and I connected via video for him to do a transfer of care from previous PCP and also to discuss upper GI symptoms/acid reflux.  Patient had previously been seeing Dr. Adriana Simasook, last seen in 2018.  Patient has used antiacid medications off and on throughout the years, but has noticed himself needing omeprazole daily to help control symptoms.  Patient feels the 20 mg dose is still not fully controlling acid reflux symptoms.  Denies nausea or vomiting.  Denies abdominal pain, just states upper abdomen is "burning feeling" at times. Has never had endoscopy.   Denies chest pain, cough, shortness breath or wheezing.  Denies urinary problems.  Denies fever or chills.  Denies body aches.   ROS: See pertinent positives and negatives per HPI.  Past Medical History:  Diagnosis Date  .  Morbid obesity (HCC)     Past Surgical History:  Procedure Laterality Date  . QUADRICEPS TENDON REPAIR      Family History  Problem Relation Age of Onset  . Hyperlipidemia Father    Social History   Tobacco Use  . Smoking status: Never Smoker  . Smokeless tobacco: Never Used  Substance Use Topics  . Alcohol use: Yes    Current Outpatient Medications:  .  pantoprazole (PROTONIX) 40 MG tablet, Take 1 tablet (40 mg total) by mouth daily., Disp: 90 tablet, Rfl: 1  EXAM:  GENERAL: alert, oriented, appears well and in no acute distress  HEENT: atraumatic, conjunttiva clear, no obvious abnormalities on inspection of external nose and ears  NECK: normal movements of the head and neck  LUNGS: on inspection no signs of respiratory distress, breathing rate appears normal, no obvious gross SOB, gasping or wheezing  CV: no obvious cyanosis  MS: moves all visible extremities without noticeable abnormality  PSYCH/NEURO: pleasant and cooperative, no obvious depression or anxiety, speech and thought processing grossly intact  ASSESSMENT AND PLAN:  Discussed the following assessment and plan:  GERD - patient symptoms do seem consistent with a acid reflux/GERD.  We will change him to Protonix daily for at least 8 to 12 weeks.  Also discussed food and dietary changes he can do to help combat acid reflux such as avoiding spicy and citrus foods, avoiding greasy and heavy foods, increasing water intake, keeping head of bed elevated at night weight loss and exercise.  I did place referral to gastroenterology for endoscopy evaluation and we also  discussed need for colonoscopy/PSA screening due to his 48th birthday coming up in November 2020.  We will also get blood work and plan to have patient come in for complete physical exam before the end of 2020.   I discussed the assessment and treatment plan with the patient. The patient was provided an opportunity to ask questions and all were answered.  The patient agreed with the plan and demonstrated an understanding of the instructions.   The patient was advised to call back or seek an in-person evaluation if the symptoms worsen or if the condition fails to improve as anticipated.  I provided 25 minutes of video face-to-face time during this encounter. Discussed causes of GERD, diet changes he can do, work up needed, lab work, plan to refer to GI for endoscopy and need for upcoming colonoscopy due to 48th birthday (NOV 2020).  Jodelle Green, FNP

## 2018-12-22 NOTE — Telephone Encounter (Signed)
Called Pt and scheduled lab for 7/23 @10 :45am  and Physical for 7/27 @ 9:00am

## 2018-12-22 NOTE — Telephone Encounter (Signed)
Please schedule him for fasting lab work sometime in the next few weeks  Also please schedule CPE in office for sometime before the end of this calender year  Thanks!  LG

## 2019-01-01 ENCOUNTER — Other Ambulatory Visit: Payer: Self-pay

## 2019-01-01 ENCOUNTER — Other Ambulatory Visit (INDEPENDENT_AMBULATORY_CARE_PROVIDER_SITE_OTHER): Payer: BC Managed Care – PPO

## 2019-01-01 DIAGNOSIS — E559 Vitamin D deficiency, unspecified: Secondary | ICD-10-CM

## 2019-01-01 DIAGNOSIS — Z125 Encounter for screening for malignant neoplasm of prostate: Secondary | ICD-10-CM | POA: Diagnosis not present

## 2019-01-01 DIAGNOSIS — K219 Gastro-esophageal reflux disease without esophagitis: Secondary | ICD-10-CM | POA: Diagnosis not present

## 2019-01-01 LAB — COMPREHENSIVE METABOLIC PANEL
ALT: 23 U/L (ref 0–53)
AST: 25 U/L (ref 0–37)
Albumin: 4.6 g/dL (ref 3.5–5.2)
Alkaline Phosphatase: 43 U/L (ref 39–117)
BUN: 13 mg/dL (ref 6–23)
CO2: 27 mEq/L (ref 19–32)
Calcium: 9.7 mg/dL (ref 8.4–10.5)
Chloride: 103 mEq/L (ref 96–112)
Creatinine, Ser: 0.94 mg/dL (ref 0.40–1.50)
GFR: 102.91 mL/min (ref >60.00)
Glucose, Bld: 88 mg/dL (ref 70–99)
Potassium: 4 mEq/L (ref 3.5–5.1)
Sodium: 138 mEq/L (ref 135–145)
Total Bilirubin: 1.3 mg/dL — ABNORMAL HIGH (ref 0.2–1.2)
Total Protein: 7.2 g/dL (ref 6.0–8.3)

## 2019-01-01 LAB — CBC WITH DIFFERENTIAL/PLATELET
Basophils Absolute: 0 10*3/uL (ref 0.0–0.1)
Basophils Relative: 0.8 % (ref 0.0–3.0)
Eosinophils Absolute: 0.1 10*3/uL (ref 0.0–0.7)
Eosinophils Relative: 2.6 % (ref 0.0–5.0)
HCT: 45.2 % (ref 39.0–52.0)
Hemoglobin: 15.2 g/dL (ref 13.0–17.0)
Lymphocytes Relative: 26.7 % (ref 12.0–46.0)
Lymphs Abs: 1.5 10*3/uL (ref 0.7–4.0)
MCHC: 33.5 g/dL (ref 30.0–36.0)
MCV: 89.3 fl (ref 78.0–100.0)
Monocytes Absolute: 0.6 10*3/uL (ref 0.1–1.0)
Monocytes Relative: 10.3 % (ref 3.0–12.0)
Neutro Abs: 3.3 10*3/uL (ref 1.4–7.7)
Neutrophils Relative %: 59.6 % (ref 43.0–77.0)
Platelets: 176 10*3/uL (ref 150.0–400.0)
RBC: 5.07 Mil/uL (ref 4.22–5.81)
RDW: 13.7 % (ref 11.5–15.5)
WBC: 5.6 10*3/uL (ref 4.0–10.5)

## 2019-01-01 LAB — LIPID PANEL
Cholesterol: 138 mg/dL (ref 0–200)
HDL: 46.5 mg/dL (ref >39.00)
LDL Cholesterol: 79 mg/dL (ref 0–99)
NonHDL: 91.46
Total CHOL/HDL Ratio: 3
Triglycerides: 64 mg/dL (ref 0.0–149.0)
VLDL: 12.8 mg/dL (ref 0.0–40.0)

## 2019-01-01 LAB — PSA: PSA: 0.69 ng/mL (ref 0.10–4.00)

## 2019-01-01 LAB — B12 AND FOLATE PANEL
Folate: 15.6 ng/mL (ref >5.9)
Vitamin B-12: 492 pg/mL (ref 211–911)

## 2019-01-01 LAB — TSH: TSH: 0.71 u[IU]/mL (ref 0.35–4.50)

## 2019-01-01 LAB — VITAMIN D 25 HYDROXY (VIT D DEFICIENCY, FRACTURES): VITD: 10.21 ng/mL — ABNORMAL LOW (ref 30.00–100.00)

## 2019-01-02 MED ORDER — VITAMIN D (ERGOCALCIFEROL) 1.25 MG (50000 UNIT) PO CAPS: 50000.0000 [IU] | ORAL_CAPSULE | ORAL | 1 refills | Status: AC

## 2019-01-02 NOTE — Addendum Note (Signed)
Addended by: Philis Nettle on: 01/02/2019 11:56 AM   Modules accepted: Orders

## 2019-01-05 ENCOUNTER — Encounter: Payer: Self-pay | Admitting: Family Medicine

## 2019-01-05 ENCOUNTER — Ambulatory Visit (INDEPENDENT_AMBULATORY_CARE_PROVIDER_SITE_OTHER): Payer: BC Managed Care – PPO | Admitting: Family Medicine

## 2019-01-05 ENCOUNTER — Other Ambulatory Visit: Payer: Self-pay

## 2019-01-05 VITALS — BP 112/82 | HR 84 | Temp 98.7°F | Resp 18 | Ht 74.5 in | Wt 320.0 lb

## 2019-01-05 DIAGNOSIS — Z0000 Encounter for general adult medical examination without abnormal findings: Secondary | ICD-10-CM

## 2019-01-05 DIAGNOSIS — G4733 Obstructive sleep apnea (adult) (pediatric): Secondary | ICD-10-CM

## 2019-01-05 DIAGNOSIS — Z1211 Encounter for screening for malignant neoplasm of colon: Secondary | ICD-10-CM

## 2019-01-05 DIAGNOSIS — E559 Vitamin D deficiency, unspecified: Secondary | ICD-10-CM | POA: Diagnosis not present

## 2019-01-05 NOTE — Patient Instructions (Addendum)
Take Vitamin D 50, 000 units once per week (Rx at [facility]).  Will recheck level in 12 weeks.  Work on [employer] and physical activity --- you can do it!

## 2019-01-05 NOTE — Progress Notes (Signed)
Subjective:    Patient ID: Mario Porter, male    DOB: 1970/07/14, 48 y.o.   MRN: [id number]  HPI  Patient presents to clinic for complete physical exam.  Overall he is feeling well has no complaints today.  He did have lab work done and labs reviewed with patient.  He was found to be vitamin D deficient, so we will start vitamin D replacement.  Patient does have a history of sleep apnea and wears CPAP on occasion however does not like wearing it.  He is hopeful that when he loses some weight he will not need the CPAP.  He is working on eating healthier and being more physically active with goal of losing at least 40 pounds.   He sees the eye doctor every 1 to 2 years and dentist every 6 months.  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  He will turn 38 in November of this year, we will plan to get colonoscopy set up.  Patient Active Problem List   Diagnosis Date Noted  . Allergic rhinitis 02/28/2017  . Essential hypertension 11/27/2016  . OSA (obstructive sleep apnea) 11/27/2016   Social History   Tobacco Use  . Smoking status: Never Smoker  . Smokeless tobacco: Never Used  Substance Use Topics  . Alcohol use: Yes   Past Surgical History:  Procedure Laterality Date  . QUADRICEPS TENDON REPAIR     Family History  Problem Relation Age of Onset  . Hyperlipidemia Father     Review of Systems  Constitutional: Negative for chills, fatigue and fever.  HENT: Negative for congestion, ear pain, sinus pain and sore throat.   Eyes: Negative.   Respiratory: Negative for cough, shortness of breath and wheezing.   Cardiovascular: Negative for chest pain, palpitations and leg swelling.  Gastrointestinal: Negative for abdominal pain, diarrhea, nausea and vomiting.  Genitourinary: Negative for dysuria, frequency and urgency.  Musculoskeletal: Negative for arthralgias and myalgias.  Skin: Negative for color change, pallor and rash.   Neurological: Negative for syncope, light-headedness and headaches.  Psychiatric/Behavioral: The patient is not nervous/anxious.       Objective:   Physical Exam Vitals signs and nursing note reviewed.  Constitutional:      General: He is not in acute distress.    Appearance: He is obese. He is not ill-appearing, toxic-appearing or diaphoretic.  HENT:     Head: Normocephalic and atraumatic.     Right Ear: Tympanic membrane, ear canal and external ear normal.     Left Ear: Tympanic membrane, ear canal and external ear normal.  Eyes:     General: No scleral icterus.    Extraocular Movements: Extraocular movements intact.     Conjunctiva/sclera: Conjunctivae normal.     Pupils: Pupils are equal, round, and reactive to light.  Neck:     Musculoskeletal: Normal range of motion and neck supple. No neck rigidity.     Vascular: No carotid bruit.  Cardiovascular:     Rate and Rhythm: Normal rate and regular rhythm.     Heart sounds: Normal heart sounds.  Pulmonary:     Effort: Pulmonary effort is normal. No respiratory distress.     Breath sounds: Normal breath sounds. No wheezing, rhonchi or rales.  Abdominal:     General: Bowel sounds are normal. There is no distension.     Palpations: Abdomen is soft. There is no mass.     Tenderness: There is no abdominal tenderness. There  is no right CVA tenderness, left CVA tenderness, guarding or rebound.     Hernia: No hernia is present.  Genitourinary:    Penis: Normal.      Scrotum/Testes: Normal.     Comments: Declines Prostate exam in clinic. Musculoskeletal: Normal range of motion.     Right lower leg: No edema.     Left lower leg: No edema.  Lymphadenopathy:     Cervical: No cervical adenopathy.  Skin:    General: Skin is warm and dry.     Capillary Refill: Capillary refill takes less than 2 seconds.     Coloration: Skin is not jaundiced.  Neurological:     General: No focal deficit present.     Mental Status: He is alert and  oriented to person, place, and time. Mental status is at baseline.     Cranial Nerves: No cranial nerve deficit.     Motor: No weakness.     Gait: Gait normal.  Psychiatric:        Mood and Affect: Mood normal.        Behavior: Behavior normal.        Thought Content: Thought content normal.        Judgment: Judgment normal.    Today's Vitals   01/05/19 0916  BP: 112/82  Pulse: 84  Resp: 18  Temp: 98.7 F (37.1 C)  TempSrc: Oral  SpO2: 93%  Weight: (!) 320 lb (145.2 kg)  Height: 6' 2.5" (1.892 m)   Body mass index is 40.54 kg/m.     Assessment & Plan:    Well adult exam -- overall patient is doing well.  Lab work is unremarkable other than low vitamin D.  Patient always wear seatbelt when in car.  Sees dentist and eye doctor regularly.  Discussed safe sun practices including wearing SPF at least 30 when outdoors and also wearing a hat and long sleeves to protect skin.  He will turn 48 in November, we will get colonoscopy referral in place.  Vitamin D deficiency - patient will take vitamin D supplement, 50,000 units once weekly for 12 weeks and then we will recheck levels after 12 weeks.  Morbid obesity - discussed healthy diet and regular physical activity to help promote weight loss and overall health.  Recommended diet high in lean proteins and vegetables, lower carbohydrates and lower processed sugars.  Recommended physical activity at least 5 days a week for 30 minutes including walking, cardio exercise, weight lifting and increasing physical stamina as tolerated.  OSA -- patient continue CPAP and will consider rechecking pulmonology the next few months as he works on his weight loss. Encouraged him to wear CPAP every night.   Patient will follow-up annually for physical exam and every 6 months for recheck on chronic medical conditions.  He will let me know if he changes mind about seeing pulmonology sooner in regards to CPAP adjustments.

## 2019-01-21 ENCOUNTER — Encounter: Payer: Self-pay | Admitting: *Deleted

## 2019-01-28 ENCOUNTER — Ambulatory Visit: Payer: BC Managed Care – PPO | Admitting: Gastroenterology

## 2019-01-28 ENCOUNTER — Other Ambulatory Visit: Payer: Self-pay

## 2019-01-28 ENCOUNTER — Encounter: Payer: Self-pay | Admitting: Gastroenterology

## 2019-01-28 VITALS — BP 129/97 | HR 63 | Temp 98.2°F

## 2019-01-28 DIAGNOSIS — K219 Gastro-esophageal reflux disease without esophagitis: Secondary | ICD-10-CM

## 2019-01-28 DIAGNOSIS — R17 Unspecified jaundice: Secondary | ICD-10-CM | POA: Diagnosis not present

## 2019-01-28 DIAGNOSIS — Z1211 Encounter for screening for malignant neoplasm of colon: Secondary | ICD-10-CM

## 2019-01-29 NOTE — Progress Notes (Signed)
[facility]  [place],  [address]  Main: [phone]  Fax: [phone]   Gastroenterology Consultation  Referring Provider:     Jodelle Green, FNP Primary Care Physician:  Jodelle Green, FNP Reason for Consultation:     GERD        HPI:    Chief Complaint  Patient presents with  . New Patient (Initial Visit)    GERD    Mario Porter is a 48 y.o. y/o male referred for consultation & management  by Dr. Jodelle Green, FNP.  Patient reports when he was initially referred to [place] by his PCP, he was having severe reflux symptoms.  However, since then he has instituted healthy eating habits and intentionally lost weight which has completely resolved his symptoms.  He was previously taking Protonix which helped, but he stopped taking Protonix after his weight loss and the symptoms have not returned.  The patient denies abdominal or flank pain, anorexia, nausea or vomiting, dysphagia, change in bowel habits or black or bloody stools or weight loss.  No family history of colon cancer.  No prior colonoscopy or EGD.  Past Medical History:  Diagnosis Date  . Morbid obesity ([facility])     Past Surgical History:  Procedure Laterality Date  . QUADRICEPS TENDON REPAIR      Prior to Admission medications   Medication Sig Start Date End Date Taking? Authorizing Provider  Vitamin D, Ergocalciferol, (DRISDOL) 1.25 MG (50000 UT) CAPS capsule Take 1 capsule (50,000 Units total) by mouth every 7 (seven) days. 01/02/19  Yes Guse, Jacquelynn Cree, FNP  pantoprazole (PROTONIX) 40 MG tablet Take 1 tablet (40 mg total) by mouth daily. Patient not taking: Reported on 01/28/2019 12/22/18   Jodelle Green, FNP    Family History  Problem Relation Age of Onset  . Hyperlipidemia Father      Social History   Tobacco Use  . Smoking status: Never Smoker  . Smokeless tobacco: Never Used  Substance Use Topics  . Alcohol use: Yes  . Drug use: No    Allergies as of  01/28/2019 - Review Complete 01/28/2019  Allergen Reaction Noted  . No known allergies  12/22/2018    Review of Systems:    All systems reviewed and negative except where noted in HPI.   Physical Exam:  BP (!) 129/97 (BP Location: Right Arm, Patient Position: Sitting, Cuff Size: Large)   Pulse 63   Temp 98.2 F (36.8 C) (Oral)  No LMP for male patient. Psych:  Alert and cooperative. Normal mood and affect. General:   Alert,  Well-developed, well-nourished, pleasant and cooperative in NAD Head:  Normocephalic and atraumatic. Eyes:  Sclera clear, no icterus.   Conjunctiva pink. Ears:  Normal auditory acuity. Nose:  No deformity, discharge, or lesions. Mouth:  No deformity or lesions,oropharynx pink & moist. Neck:  Supple; no masses or thyromegaly. Abdomen:  Normal bowel sounds.  No bruits.  Soft, non-tender and non-distended without masses, hepatosplenomegaly or hernias noted.  No guarding or rebound tenderness.    Msk:  Symmetrical without gross deformities. Good, equal movement & strength bilaterally. Pulses:  Normal pulses noted. Extremities:  No clubbing or edema.  No cyanosis. Neurologic:  Alert and oriented x3;  grossly normal neurologically. Skin:  Intact without significant lesions or rashes. No jaundice. Lymph Nodes:  No significant cervical adenopathy. Psych:  Alert and cooperative. Normal mood and affect.   Labs: CBC    Component  Value Date/Time   WBC 5.6 01/01/2019 1041   RBC 5.07 01/01/2019 1041   HGB 15.2 01/01/2019 1041   HGB 15.8 09/30/2011 0835   HCT 45.2 01/01/2019 1041   HCT 48.1 09/30/2011 0835   PLT 176.0 01/01/2019 1041   PLT 174 09/30/2011 0835   MCV 89.3 01/01/2019 1041   MCV 91 09/30/2011 0835   MCH 30.1 09/30/2011 0835   MCHC 33.5 01/01/2019 1041   RDW 13.7 01/01/2019 1041   RDW 14.3 09/30/2011 0835   LYMPHSABS 1.5 01/01/2019 1041   MONOABS 0.6 01/01/2019 1041   EOSABS 0.1 01/01/2019 1041   BASOSABS 0.0 01/01/2019 1041   CMP      Component Value Date/Time   NA 138 01/01/2019 1041   NA 143 10/06/2011 1502   K 4.0 01/01/2019 1041   K 4.0 10/06/2011 1502   CL 103 01/01/2019 1041   CL 106 10/06/2011 1502   CO2 27 01/01/2019 1041   CO2 28 10/06/2011 1502   GLUCOSE 88 01/01/2019 1041   GLUCOSE 100 (H) 10/06/2011 1502   BUN 13 01/01/2019 1041   BUN 12 10/06/2011 1502   CREATININE 0.94 01/01/2019 1041   CREATININE 1.04 10/06/2011 1502   CALCIUM 9.7 01/01/2019 1041   CALCIUM 8.7 10/06/2011 1502   PROT 7.2 01/01/2019 1041   PROT 7.6 10/06/2011 1502   ALBUMIN 4.6 01/01/2019 1041   ALBUMIN 4.0 10/06/2011 1502   AST 25 01/01/2019 1041   AST 28 10/06/2011 1502   ALT 23 01/01/2019 1041   ALT 40 10/06/2011 1502   ALKPHOS 43 01/01/2019 1041   ALKPHOS 60 10/06/2011 1502   BILITOT 1.3 (H) 01/01/2019 1041   BILITOT 0.7 10/06/2011 1502   GFRNONAA >60 10/06/2011 1502   GFRAA >60 10/06/2011 1502    Imaging Studies: No results found.  Assessment and Plan:   Mario Porter is a 48 y.o. y/o male has been referred for GERD  Symptoms have completely resolved after lifestyle modifications and weight loss No alarm symptoms present  We discussed with the patient that upper endoscopy for GERD is usually done after chronic symptoms or alarm symptoms, or uncontrolled symptoms despite therapy.  He does not have any of these present.  If he is concerned about the symptoms that occur, an upper endoscopy can be done, but will be low utility.  Patient not interested in EGD at this time.  If symptoms recur he will let us know.  Continue lifestyle modifications and weight loss as you are.  We discussed that he is due for screening colonoscopy as per [employer] guidelines and he is willing to schedule.  I have discussed alternative options, risks & benefits,  which include, but are not limited to, bleeding, infection, perforation,respiratory complication & drug reaction.  The patient agrees with this plan & written  consent will be obtained.    I also noted that he had mildly elevated bilirubin in July 2020.  This is otherwise asymptomatic.  He may have underlying Gilbert syndrome.  We will have him repeat labs along with direct bilirubin for fractionation.  This can be done at the time of his colonoscopy appointment as well   Dr Melodie BouillonVarnita Polly Barner  Speech recognition software was used to dictate the above note.

## 2019-02-12 ENCOUNTER — Telehealth: Payer: Self-pay

## 2019-02-12 NOTE — Telephone Encounter (Signed)
Unable to contact patient, voicemail currently full. Patients colonoscopy scheduled for 03/09/2019 with Dr. Darene Lamer needs to be rescheduled due to change in physicians schedule.   Thanks,  Sharyn Lull

## 2019-04-07 ENCOUNTER — Other Ambulatory Visit (INDEPENDENT_AMBULATORY_CARE_PROVIDER_SITE_OTHER): Payer: BC Managed Care – PPO

## 2019-04-07 ENCOUNTER — Other Ambulatory Visit: Payer: Self-pay

## 2019-04-07 DIAGNOSIS — E559 Vitamin D deficiency, unspecified: Secondary | ICD-10-CM

## 2019-04-09 LAB — VITAMIN D 25 HYDROXY (VIT D DEFICIENCY, FRACTURES): VITD: 21.84 ng/mL — ABNORMAL LOW (ref 30.00–100.00)

## 2019-04-15 ENCOUNTER — Telehealth: Payer: Self-pay

## 2019-04-15 ENCOUNTER — Telehealth: Payer: Self-pay | Admitting: Gastroenterology

## 2019-04-15 NOTE — Telephone Encounter (Signed)
Called patient and left a detail message  

## 2019-04-15 NOTE — Telephone Encounter (Signed)
Pt left vm returning someone's call °

## 2019-04-15 NOTE — Telephone Encounter (Signed)
Called patient to confirm patient procedure appointment and to remind patient that they have to go for a COVID test on 04/16/2019.  Tried to call patient but mailbox Is full

## 2019-04-16 ENCOUNTER — Other Ambulatory Visit: Payer: Self-pay

## 2019-04-16 ENCOUNTER — Other Ambulatory Visit
Admission: RE | Admit: 2019-04-16 | Discharge: 2019-04-16 | Disposition: A | Discharge status: Home / Self Care | Payer: BC Managed Care – PPO | Source: Ambulatory Visit | Attending: Gastroenterology | Admitting: Gastroenterology

## 2019-04-16 DIAGNOSIS — Z20828 Contact with and (suspected) exposure to other viral communicable diseases: Secondary | ICD-10-CM | POA: Diagnosis not present

## 2019-04-16 DIAGNOSIS — Z01812 Encounter for preprocedural laboratory examination: Secondary | ICD-10-CM | POA: Diagnosis present

## 2019-04-16 LAB — SARS CORONAVIRUS 2 (TAT 6-24 HRS): SARS Coronavirus 2: NEGATIVE

## 2019-04-17 ENCOUNTER — Encounter: Payer: Self-pay | Admitting: *Deleted

## 2019-04-20 ENCOUNTER — Other Ambulatory Visit: Payer: Self-pay

## 2019-04-20 ENCOUNTER — Encounter
Admission: RE | Disposition: A | Discharge status: Home / Self Care | Payer: Self-pay | Source: Home / Self Care | Attending: Gastroenterology

## 2019-04-20 ENCOUNTER — Ambulatory Visit
Admission: RE | Admit: 2019-04-20 | Discharge: 2019-04-20 | Disposition: A | Discharge status: Home / Self Care | Payer: BC Managed Care – PPO | Attending: Gastroenterology | Admitting: Gastroenterology

## 2019-04-20 ENCOUNTER — Ambulatory Visit: Payer: BC Managed Care – PPO | Admitting: Anesthesiology

## 2019-04-20 DIAGNOSIS — Z1211 Encounter for screening for malignant neoplasm of colon: Secondary | ICD-10-CM

## 2019-04-20 DIAGNOSIS — Z79899 Other long term (current) drug therapy: Secondary | ICD-10-CM | POA: Insufficient documentation

## 2019-04-20 DIAGNOSIS — K641 Second degree hemorrhoids: Secondary | ICD-10-CM | POA: Diagnosis not present

## 2019-04-20 DIAGNOSIS — K5730 Diverticulosis of large intestine without perforation or abscess without bleeding: Secondary | ICD-10-CM | POA: Diagnosis not present

## 2019-04-20 DIAGNOSIS — K219 Gastro-esophageal reflux disease without esophagitis: Secondary | ICD-10-CM | POA: Insufficient documentation

## 2019-04-20 DIAGNOSIS — G4730 Sleep apnea, unspecified: Secondary | ICD-10-CM | POA: Diagnosis not present

## 2019-04-20 DIAGNOSIS — K635 Polyp of colon: Secondary | ICD-10-CM

## 2019-04-20 DIAGNOSIS — Z6841 Body Mass Index (BMI) 40.0 and over, adult: Secondary | ICD-10-CM | POA: Insufficient documentation

## 2019-04-20 HISTORY — DX: Sleep apnea, unspecified: G47.30

## 2019-04-20 HISTORY — DX: Gastro-esophageal reflux disease without esophagitis: K21.9

## 2019-04-20 HISTORY — PX: POLYPECTOMY: SHX5525

## 2019-04-20 HISTORY — PX: COLONOSCOPY WITH PROPOFOL: SHX5780

## 2019-04-20 SURGERY — COLONOSCOPY WITH PROPOFOL
Anesthesia: General | Site: Rectum

## 2019-04-20 MED ORDER — LIDOCAINE HCL (CARDIAC) PF 100 MG/5ML IV SOSY
PREFILLED_SYRINGE | INTRAVENOUS | Status: DC | PRN
  Administered 2019-04-20: 30 mg via INTRAVENOUS

## 2019-04-20 MED ORDER — PROPOFOL 10 MG/ML IV BOLUS
INTRAVENOUS | Status: DC | PRN
  Administered 2019-04-20: 30 mg via INTRAVENOUS
  Administered 2019-04-20: 150 mg via INTRAVENOUS
  Administered 2019-04-20 (×3): 40 mg via INTRAVENOUS
  Administered 2019-04-20: 50 mg via INTRAVENOUS

## 2019-04-20 MED ORDER — LACTATED RINGERS IV SOLN
INTRAVENOUS | Status: DC | PRN
  Administered 2019-04-20: 09:00:00 via INTRAVENOUS

## 2019-04-20 MED ORDER — STERILE WATER FOR IRRIGATION IR SOLN
Status: DC | PRN
  Administered 2019-04-20: 50 mL

## 2019-04-20 SURGICAL SUPPLY — 6 items
CANISTER SUCT 1200ML W/VALVE (MISCELLANEOUS) ×3 IMPLANT
FORCEPS BIOP RAD 4 LRG CAP 4 (CUTTING FORCEPS) ×3 IMPLANT
GOWN CVR UNV OPN BCK APRN NK (MISCELLANEOUS) ×2 IMPLANT
GOWN ISOL THUMB LOOP REG UNIV (MISCELLANEOUS) ×4
KIT ENDO PROCEDURE OLY (KITS) ×3 IMPLANT
WATER STERILE IRR 250ML POUR (IV SOLUTION) ×3 IMPLANT

## 2019-04-20 NOTE — Transfer of Care (Signed)
Immediate Anesthesia Transfer of Care Note  Patient: Psychologist, occupational  Procedure(s) Performed: COLONOSCOPY WITH BIOPSY (N/A Rectum) POLYPECTOMY (N/A Rectum)  Patient Location: PACU  Anesthesia Type: General  Level of Consciousness: awake, alert  and patient cooperative  Airway and Oxygen Therapy: Patient Spontanous Breathing and Patient connected to supplemental oxygen  Post-op Assessment: Post-op Vital signs reviewed, Patient's Cardiovascular Status Stable, Respiratory Function Stable, Patent Airway and No signs of Nausea or vomiting  Post-op Vital Signs: Reviewed and stable  Complications: No apparent anesthesia complications

## 2019-04-20 NOTE — Anesthesia Preprocedure Evaluation (Signed)
Anesthesia Evaluation  Patient identified by MRN, date of birth, ID band Patient awake    History of Anesthesia Complications Negative for: history of anesthetic complications  Airway Mallampati: II  TM Distance: >3 FB Neck ROM: Full    Dental no notable dental hx.    Pulmonary sleep apnea and Continuous Positive Airway Pressure Ventilation ,    Pulmonary exam normal        Cardiovascular Exercise Tolerance: Good negative cardio ROS       Neuro/Psych negative neurological ROS     GI/Hepatic GERD  Medicated,  Endo/Other  Morbid obesity  Renal/GU      Musculoskeletal negative musculoskeletal ROS (+)   Abdominal   Peds  Hematology   Anesthesia Other Findings   Reproductive/Obstetrics                             Anesthesia Physical Anesthesia Plan  ASA: III  Anesthesia Plan: General   Post-op Pain Management:    Induction: Intravenous  PONV Risk Score and Plan: 2 and Propofol infusion and TIVA  Airway Management Planned: Natural Airway and Nasal Cannula  Additional Equipment: None  Intra-op Plan:   Post-operative Plan:   Informed Consent: I have reviewed the patients History and Physical, chart, labs and discussed the procedure including the risks, benefits and alternatives for the proposed anesthesia with the patient or authorized representative who has indicated his/her understanding and acceptance.       Plan Discussed with: CRNA  Anesthesia Plan Comments:         Anesthesia Quick Evaluation

## 2019-04-20 NOTE — Anesthesia Postprocedure Evaluation (Signed)
Anesthesia Post Note  Patient: Psychologist, occupational  Procedure(s) Performed: COLONOSCOPY WITH BIOPSY (N/A Rectum) POLYPECTOMY (N/A Rectum)     Patient location during evaluation: PACU Anesthesia Type: General Level of consciousness: awake and alert Pain management: pain level controlled Vital Signs Assessment: post-procedure vital signs reviewed and stable Respiratory status: spontaneous breathing, nonlabored ventilation, respiratory function stable and patient connected to nasal cannula oxygen Cardiovascular status: blood pressure returned to baseline and stable Postop Assessment: no apparent nausea or vomiting Anesthetic complications: no Comments: Tachycardic to 140s/150s upon arrival to PACU. 12 lead EKG showed sinus tachycardia and patient denied palpitations or chest pain, blood pressure stable. 1L of IVF administered with improvement of HR to 90s.     Adele Barthel Lily Kernen

## 2019-04-20 NOTE — Op Note (Signed)
[facility] Gastroenterology Patient Name: Mario Porter Procedure Date: 04/20/2019 8:50 AM MRN: [id number] Account #: [id number] Date of Birth: 09/03/1970 Admit Type: Outpatient Age: 48 Room: [facility] OR ROOM 01 Gender: Male Note Status: Finalized Procedure:             Colonoscopy Indications:           Screening for colorectal malignant neoplasm Providers:             Lucilla Lame MD, MD Referring MD:          Jacquelynn Cree. Guse (Referring MD) Medicines:             Propofol per Anesthesia Complications:         No immediate complications. Procedure:             Pre-Anesthesia Assessment:                        - Prior to the procedure, a History and Physical was                         performed, and patient medications and allergies were                         reviewed. The patient's tolerance of previous                         anesthesia was also reviewed. The risks and benefits                         of the procedure and the sedation options and risks                         were discussed with the patient. All questions were                         answered, and informed consent was obtained. Prior                         Anticoagulants: The patient has taken no previous                         anticoagulant or antiplatelet agents. ASA Grade                         Assessment: II - A patient with mild systemic disease.                         After reviewing the risks and benefits, the patient                         was deemed in satisfactory condition to undergo the                         procedure.                        After obtaining informed consent, the colonoscope was  passed under direct vision. Throughout the procedure,                         the patient's blood pressure, pulse, and oxygen                         saturations were monitored continuously. The was                         introduced through the anus and advanced  to the the                         cecum, identified by appendiceal orifice and ileocecal                         valve. The colonoscopy was performed without                         difficulty. The patient tolerated the procedure well.                         The quality of the bowel preparation was excellent. Findings:      The perianal and digital rectal examinations were normal.      Two sessile polyps were found in the sigmoid colon. The polyps were 2 to       3 mm in size. These polyps were removed with a cold biopsy forceps.       Resection and retrieval were complete.      A few small-mouthed diverticula were found in the sigmoid colon.      Non-bleeding internal hemorrhoids were found during retroflexion. The       hemorrhoids were Grade II (internal hemorrhoids that prolapse but reduce       spontaneously). Impression:            - Two 2 to 3 mm polyps in the sigmoid colon, removed                         with a cold biopsy forceps. Resected and retrieved.                        - Diverticulosis in the sigmoid colon.                        - Non-bleeding internal hemorrhoids. Recommendation:        - Discharge patient to home.                        - Resume previous diet.                        - Continue present medications.                        - Await pathology results.                        - Repeat colonoscopy in 5 years if polyp adenoma and  10 years if hyperplastic Procedure Code(s):     --- Professional ---                        [id number], Colonoscopy, flexible; with biopsy, single or                         multiple Diagnosis Code(s):     --- Professional ---                        Z12.11, Encounter for screening for malignant neoplasm                         of colon                        K63.5, Polyp of colon CPT copyright 2019 [employer]. All rights reserved. The codes documented in this report are preliminary and upon  coder review may  be revised to meet current compliance requirements. Midge Miniumarren Christien Frankl MD, MD 04/20/2019 9:14:50 AM This report has been signed electronically. Number of Addenda: 0 Note Initiated On: 04/20/2019 8:50 AM Scope Withdrawal Time: 0 hours 8 minutes 0 seconds  Total Procedure Duration: 0 hours 11 minutes 2 seconds  Estimated Blood Loss:  Estimated blood loss: none.      [facility]

## 2019-04-20 NOTE — Anesthesia Procedure Notes (Signed)
Date/Time: 04/20/2019 8:55 AM Performed by: Cameron Ali, CRNA Pre-anesthesia Checklist: Patient identified, Emergency Drugs available, Suction available, Timeout performed and Patient being monitored Patient Re-evaluated:Patient Re-evaluated prior to induction Oxygen Delivery Method: Nasal cannula Placement Confirmation: positive ETCO2

## 2019-04-20 NOTE — H&P (Signed)
Mario Minium, MD [facility] [address] [place], [place] [address] Phone: [phone] Fax : [phone]  Primary Care Physician:  Tracey Harries, FNP Primary Gastroenterologist:  Dr. Servando Snare  Pre-Procedure History & Physical: HPI:  Mario Porter is a 48 y.o. male is here for a screening colonoscopy.   Past Medical History:  Diagnosis Date  . GERD (gastroesophageal reflux disease)   . Morbid obesity (HCC)   . Sleep apnea     Past Surgical History:  Procedure Laterality Date  . QUADRICEPS TENDON REPAIR      Prior to Admission medications   Medication Sig Start Date End Date Taking? Authorizing Provider  Vitamin D, Ergocalciferol, (DRISDOL) 1.25 MG (50000 UT) CAPS capsule Take 1 capsule (50,000 Units total) by mouth every 7 (seven) days. 01/02/19  Yes Guse, Janna Arch, FNP  pantoprazole (PROTONIX) 40 MG tablet Take 1 tablet (40 mg total) by mouth daily. Patient not taking: Reported on 01/28/2019 12/22/18   Tracey Harries, FNP    Allergies as of 01/28/2019 - Review Complete 01/28/2019  Allergen Reaction Noted  . No known allergies  12/22/2018    Family History  Problem Relation Age of Onset  . Hyperlipidemia Father     Social History   Socioeconomic History  . Marital status: Married    Spouse name: Not on file  . Number of children: Not on file  . Years of education: Not on file  . Highest education level: Not on file  Occupational History  . Not on file  Social Needs  . Financial resource strain: Not on file  . Food insecurity    Worry: Not on file    Inability: Not on file  . Transportation needs    Medical: Not on file    Non-medical: Not on file  Tobacco Use  . Smoking status: Never Smoker  . Smokeless tobacco: Never Used  Substance and Sexual Activity  . Alcohol use: Yes  . Drug use: No  . Sexual activity: Not on file  Lifestyle  . Physical activity    Days per week: Not on file    Minutes per session: Not on file  . Stress: Not on file   Relationships  . Social Musician on phone: Not on file    Gets together: Not on file    Attends religious service: Not on file    Active member of club or organization: Not on file    Attends meetings of clubs or organizations: Not on file    Relationship status: Not on file  . Intimate partner violence    Fear of current or ex partner: Not on file    Emotionally abused: Not on file    Physically abused: Not on file    Forced sexual activity: Not on file  Other Topics Concern  . Not on file  Social History Narrative  . Not on file    Review of Systems: See HPI, otherwise negative ROS  Physical Exam: BP (!) 133/112   Pulse 78   Temp (!) 97.3 F (36.3 C)   Ht 6\' 2"  (1.88 m)   Wt (!) 145.6 kg   SpO2 100%   BMI 41.21 kg/m  General:   Alert,  pleasant and cooperative in NAD Head:  Normocephalic and atraumatic. Neck:  Supple; no masses or thyromegaly. Lungs:  Clear throughout to auscultation.    Heart:  Regular rate and rhythm. Abdomen:  Soft, nontender and nondistended. Normal bowel sounds,  without guarding, and without rebound.   Neurologic:  Alert and  oriented x4;  grossly normal neurologically.  Impression/Plan: [name] is now here to undergo a screening colonoscopy.  Risks, benefits, and alternatives regarding colonoscopy have been reviewed with the patient.  Questions have been answered.  All parties agreeable.

## 2019-04-21 ENCOUNTER — Encounter: Payer: Self-pay | Admitting: Gastroenterology

## 2019-04-22 ENCOUNTER — Encounter: Payer: Self-pay | Admitting: Gastroenterology

## 2019-04-28 ENCOUNTER — Encounter: Payer: Self-pay | Admitting: Gastroenterology
